# Patient Record
Sex: Female | Born: 1980 | Race: Black or African American | Hispanic: No | Marital: Single | Smoking: Never smoker
Health system: Southern US, Community
[De-identification: ages and names within clinical notes are randomized; demographics above are authoritative.]

## PROBLEM LIST (undated history)

## (undated) DIAGNOSIS — J45909 Unspecified asthma, uncomplicated: Secondary | ICD-10-CM

---

## 2015-11-09 ENCOUNTER — Encounter: Payer: Self-pay | Admitting: *Deleted

## 2015-11-09 ENCOUNTER — Emergency Department: Payer: Self-pay

## 2015-11-09 ENCOUNTER — Emergency Department
Admission: EM | Admit: 2015-11-09 | Discharge: 2015-11-09 | Disposition: A | Discharge status: Home / Self Care | Payer: Self-pay | Attending: Emergency Medicine | Admitting: Emergency Medicine

## 2015-11-09 DIAGNOSIS — R1011 Right upper quadrant pain: Secondary | ICD-10-CM | POA: Insufficient documentation

## 2015-11-09 DIAGNOSIS — Z91040 Latex allergy status: Secondary | ICD-10-CM | POA: Insufficient documentation

## 2015-11-09 DIAGNOSIS — R42 Dizziness and giddiness: Secondary | ICD-10-CM | POA: Insufficient documentation

## 2015-11-09 DIAGNOSIS — R112 Nausea with vomiting, unspecified: Secondary | ICD-10-CM | POA: Insufficient documentation

## 2015-11-09 DIAGNOSIS — M545 Low back pain: Secondary | ICD-10-CM | POA: Insufficient documentation

## 2015-11-09 DIAGNOSIS — F419 Anxiety disorder, unspecified: Secondary | ICD-10-CM | POA: Insufficient documentation

## 2015-11-09 DIAGNOSIS — R1033 Periumbilical pain: Secondary | ICD-10-CM | POA: Insufficient documentation

## 2015-11-09 DIAGNOSIS — R000 Tachycardia, unspecified: Secondary | ICD-10-CM | POA: Insufficient documentation

## 2015-11-09 DIAGNOSIS — Z3202 Encounter for pregnancy test, result negative: Secondary | ICD-10-CM | POA: Insufficient documentation

## 2015-11-09 DIAGNOSIS — R109 Unspecified abdominal pain: Secondary | ICD-10-CM

## 2015-11-09 HISTORY — DX: Unspecified asthma, uncomplicated: J45.909

## 2015-11-09 LAB — URINALYSIS COMPLETE WITH MICROSCOPIC (ARMC ONLY)
BILIRUBIN URINE: NEGATIVE
Glucose, UA: NEGATIVE mg/dL
KETONES UR: NEGATIVE mg/dL
NITRITE: NEGATIVE
PH: 5 (ref 5.0–8.0)
Protein, ur: 100 mg/dL — AB
Specific Gravity, Urine: 1.027 (ref 1.005–1.030)

## 2015-11-09 LAB — COMPREHENSIVE METABOLIC PANEL
ALBUMIN: 4.2 g/dL (ref 3.5–5.0)
ALT: 17 U/L (ref 14–54)
ANION GAP: 8 (ref 5–15)
AST: 21 U/L (ref 15–41)
Alkaline Phosphatase: 32 U/L — ABNORMAL LOW (ref 38–126)
BILIRUBIN TOTAL: 0.7 mg/dL (ref 0.3–1.2)
BUN: 18 mg/dL (ref 6–20)
CO2: 24 mmol/L (ref 22–32)
Calcium: 9 mg/dL (ref 8.9–10.3)
Chloride: 105 mmol/L (ref 101–111)
Creatinine, Ser: 0.85 mg/dL (ref 0.44–1.00)
GFR calc non Af Amer: >60 mL/min (ref >60)
GLUCOSE: 107 mg/dL — AB (ref 65–99)
POTASSIUM: 3.4 mmol/L — AB (ref 3.5–5.1)
Sodium: 137 mmol/L (ref 135–145)
TOTAL PROTEIN: 8.6 g/dL — AB (ref 6.5–8.1)

## 2015-11-09 LAB — CBC
HEMATOCRIT: 36.2 % (ref 35.0–47.0)
HEMOGLOBIN: 11.9 g/dL — AB (ref 12.0–16.0)
MCH: 26.8 pg (ref 26.0–34.0)
MCHC: 32.8 g/dL (ref 32.0–36.0)
MCV: 81.9 fL (ref 80.0–100.0)
Platelets: 370 10*3/uL (ref 150–440)
RBC: 4.42 MIL/uL (ref 3.80–5.20)
RDW: 16.7 % — ABNORMAL HIGH (ref 11.5–14.5)
WBC: 5.5 10*3/uL (ref 3.6–11.0)

## 2015-11-09 LAB — PREGNANCY, URINE: Preg Test, Ur: NEGATIVE

## 2015-11-09 LAB — LIPASE, BLOOD: Lipase: 27 U/L (ref 11–51)

## 2015-11-09 LAB — POCT PREGNANCY, URINE: Preg Test, Ur: NEGATIVE

## 2015-11-09 MED ORDER — ONDANSETRON HCL 4 MG/2ML IJ SOLN
4.0000 mg | Freq: Once | INTRAMUSCULAR | Status: AC
  Administered 2015-11-09: 4 mg via INTRAVENOUS
  Filled 2015-11-09: qty 2

## 2015-11-09 MED ORDER — MORPHINE SULFATE (PF) 4 MG/ML IV SOLN
4.0000 mg | Freq: Once | INTRAVENOUS | Status: AC
  Administered 2015-11-09: 4 mg via INTRAVENOUS
  Filled 2015-11-09: qty 1

## 2015-11-09 MED ORDER — SODIUM CHLORIDE 0.9 % IV SOLN
1000.0000 mL | Freq: Once | INTRAVENOUS | Status: AC
Start: 2015-11-09 — End: 2015-11-09
  Administered 2015-11-09: 1000 mL via INTRAVENOUS

## 2015-11-09 MED ORDER — IOHEXOL 300 MG/ML  SOLN
125.0000 mL | Freq: Once | INTRAMUSCULAR | Status: AC | PRN
  Administered 2015-11-09: 100 mL via INTRAVENOUS

## 2015-11-09 MED ORDER — TRAMADOL HCL 50 MG PO TABS: 50.0000 mg | ORAL_TABLET | Freq: Four times a day (QID) | ORAL | Status: AC | PRN

## 2015-11-09 MED ORDER — IOHEXOL 240 MG/ML SOLN: 25.0000 mL | Freq: Once | INTRAMUSCULAR | Status: DC | PRN

## 2015-11-09 NOTE — Discharge Instructions (Signed)
°  There is a 5.7 cm cystic structure within the right retroperitoneum which is nonspecific however may represent a benign lymphangioma. Recommend follow-up CT in 6 months to assess for stability.  Indeterminate cystic lesion within the inferior aspect of the right breast. Recommend correlation with dedicated mammography/ultrasound.     Abdominal Pain, Adult Many things can cause abdominal pain. Usually, abdominal pain is not caused by a disease and will improve without treatment. It can often be observed and treated at home. Your health care provider will do a physical exam and possibly order blood tests and X-rays to help determine the seriousness of your pain. However, in many cases, more time must pass before a clear cause of the pain can be found. Before that point, your health care provider may not know if you need more testing or further treatment. HOME CARE INSTRUCTIONS Monitor your abdominal pain for any changes. The following actions may help to alleviate any discomfort you are experiencing:  Only take over-the-counter or prescription medicines as directed by your health care provider.  Do not take laxatives unless directed to do so by your health care provider.  Try a clear liquid diet (broth, tea, or water) as directed by your health care provider. Slowly move to a bland diet as tolerated. SEEK MEDICAL CARE IF:  You have unexplained abdominal pain.  You have abdominal pain associated with nausea or diarrhea.  You have pain when you urinate or have a bowel movement.  You experience abdominal pain that wakes you in the night.  You have abdominal pain that is worsened or improved by eating food.  You have abdominal pain that is worsened with eating fatty foods.  You have a fever. SEEK IMMEDIATE MEDICAL CARE IF:  Your pain does not go away within 2 hours.  You keep throwing up (vomiting).  Your pain is felt only in portions of the abdomen, such as the right side or the  left lower portion of the abdomen.  You pass bloody or black tarry stools. MAKE SURE YOU:  Understand these instructions.  Will watch your condition.  Will get help right away if you are not doing well or get worse.   This information is not intended to replace advice given to you by your health care provider. Make sure you discuss any questions you have with your health care provider.   Document Released: 07/08/2005 Document Revised: 06/19/2015 Document Reviewed: 06/07/2013 Elsevier Interactive Patient Education [employer]

## 2015-11-09 NOTE — ED Notes (Signed)
Pt. Going home with friend, pt. Spending night in [place] with father.

## 2015-11-09 NOTE — ED Notes (Addendum)
Pt states she at work today and felt left sided abd pain radiating to her back, starts at her bellybutton, state she then began to vomit and have dizziness, states the nurse gave her a shot of phenergan, arrives alert and oriented and in no acute distress

## 2015-11-09 NOTE — ED Notes (Signed)
Report given to Matt, RN

## 2015-11-09 NOTE — ED Notes (Signed)
Patient transported to CT 

## 2015-11-09 NOTE — ED Provider Notes (Signed)
[facility] Emergency Department Provider Note  ____________________________________________    I have reviewed the triage vital signs and the nursing notes.   HISTORY  Chief Complaint Abdominal Pain and Emesis    HPI Brittany Cruz is a 35 y.o. female who presents with a proximally 4 days of central moderate abdominal pain which has been constant. She has been taking Motrin without relief. Today she became nauseated and vomited and felt dizzy and was told by her company to come to the emergency department. She denies fevers chills. No recent travel. No sick contacts. She feels her back pain is something different rather than related to her abdominal pain. She denies dysuria. She has no history of abdominal surgery. She has not been eating much because of the pain     Past Medical History  Diagnosis Date  . Asthma     There are no active problems to display for this patient.   History reviewed. No pertinent past surgical history.  No current outpatient prescriptions on file.  Allergies Latex; Shellfish allergy; and Tomato  History reviewed. No pertinent family history.  Social History Social History  Substance Use Topics  . Smoking status: Never Smoker   . Smokeless tobacco: None  . Alcohol Use: None    Review of Systems  Constitutional: Negative for fever. Eyes: Negative for visual changes. ENT: Negative for sore throat Cardiovascular: Negative for chest pain. Respiratory: Negative for shortness of breath. Gastrointestinal: As above Genitourinary: Negative for dysuria. Musculoskeletal: Low back pain Skin: Negative for rash. Neurological: Negative for headaches or focal weakness Psychiatric: Positive anxiety    ____________________________________________   PHYSICAL EXAM:  VITAL SIGNS: ED Triage Vitals  Enc Vitals Group     BP 11/09/15 1656 140/82 mmHg     Pulse Rate 11/09/15 1656 120     Resp 11/09/15 1656 18     Temp  11/09/15 1656 98.7 F (37.1 C)     Temp Source 11/09/15 1656 Oral     SpO2 11/09/15 1656 100 %     Weight 11/09/15 1656 250 lb (113.399 kg)     Height 11/09/15 1656  (1.575 m)     Head Cir --      Peak Flow --      Pain Score 11/09/15 1657 10     Pain Loc --      Pain Edu? --      Excl. in GC? --      Constitutional: Alert and oriented. Well appearing and in no distress. Eyes: Conjunctivae are normal.  ENT   Head: Normocephalic and atraumatic.   Mouth/Throat: Mucous membranes are moist. Cardiovascular: Tachycardia ,regular rhythm. Normal and symmetric distal pulses are present in all extremities. No murmurs, rubs, or gallops. Respiratory: Normal respiratory effort without tachypnea nor retractions.  Gastrointestinal: Patient is most tender to palpation in the right upper quadrant and around the umbilicus. No distention. There is no CVA tenderness. Genitourinary: deferred Musculoskeletal: Nontender with normal range of motion in all extremities. No lower extremity tenderness nor edema. Neurologic:  Normal speech and language. No gross focal neurologic deficits are appreciated. Skin:  Skin is warm, dry and intact. No rash noted. Psychiatric: Mood and affect are normal. Patient exhibits appropriate insight and judgment.  ____________________________________________    LABS (pertinent positives/negatives)  Labs Reviewed  COMPREHENSIVE METABOLIC PANEL - Abnormal; Notable for the following:    Potassium 3.4 (*)    Glucose, Bld 107 (*)    Total Protein 8.6 (*)  Alkaline Phosphatase 32 (*)    All other components within normal limits  CBC - Abnormal; Notable for the following:    Hemoglobin 11.9 (*)    RDW 16.7 (*)    All other components within normal limits  URINALYSIS COMPLETEWITH MICROSCOPIC ([facility] ONLY) - Abnormal; Notable for the following:    Color, Urine YELLOW (*)    APPearance CLOUDY (*)    Hgb urine dipstick 1+ (*)    Protein, ur 100 (*)     Leukocytes, UA 1+ (*)    Bacteria, UA RARE (*)    Squamous Epithelial / LPF 6-30 (*)    All other components within normal limits  LIPASE, BLOOD  POCT PREGNANCY, URINE    ____________________________________________   EKG  ED ECG REPORT I, Jene Every, the attending physician, personally viewed and interpreted this ECG.   Date: 11/09/2015  EKG Time: 5:02 PM  Rate: 127  Rhythm: sinus tachycardia  Axis: Normal axis  Intervals:none  ST&T Change: Nonspecific   ____________________________________________    RADIOLOGY I have personally reviewed any xrays that were ordered on this patient: No acute abnormalities. Discussed incidental findings including right breast lesion which the patient about and retroperitoneal cyst which she will follow-up in 6 months with another CT scan  ____________________________________________   PROCEDURES  Procedure(s) performed: none  Critical Care performed: none  ____________________________________________   INITIAL IMPRESSION / ASSESSMENT AND PLAN / ED COURSE  Pertinent labs & imaging results that were available during my care of the patient were reviewed by me and considered in my medical decision making (see chart for details).  Patient presents with central abdominal pain of unclear etiology. Certainly she seems most tender around the gallbladder area which is  on the differential. She may also be having gastritis versus PUD versus pancreatitis. We will check labs, give fluids, morphine, Zofran and reevaluate  Ultrasound does show cholelithiasis but no cholecystitis  CT scan is unremarkable, incidental findings discussed with patient.  I will discharge the patient with outpatient follow-up. Return precautions discussed.  ____________________________________________   FINAL CLINICAL IMPRESSION(S) / ED DIAGNOSES  Final diagnoses:  Abdominal pain     Jene Every, MD 11/09/15 2033

## 2017-08-25 IMAGING — CT CT ABD-PELV W/ CM
1 of 2 series · 15 of 32 positions shown, 19 images · IV contrast (omnipaque)
Comparison: Ultrasound abdomen 11/01/2015

CLINICAL DATA: Patient with left lower quadrant pain for 6 days.
Nausea and vomiting. History of hernia repair.

EXAM:
CT ABDOMEN AND PELVIS WITH CONTRAST
TECHNIQUE: Multidetector CT imaging of the abdomen and pelvis was performed
using the standard protocol following bolus administration of
intravenous contrast.
CONTRAST:  125mL OMNIPAQUE IOHEXOL 300 MG/ML  SOLN

[Series 2: routine abd pel with · axial · 0.83mm/px · z∈[-362,+18]mm · 15 of 84 slices shown, 19 images]
[im 4/84  soft-tissue]
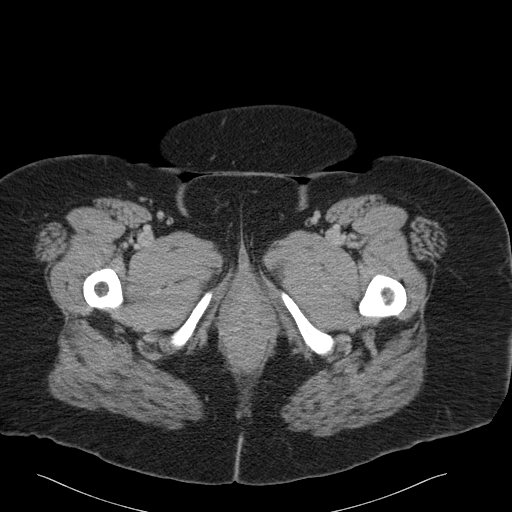
[im 4/84  bone]
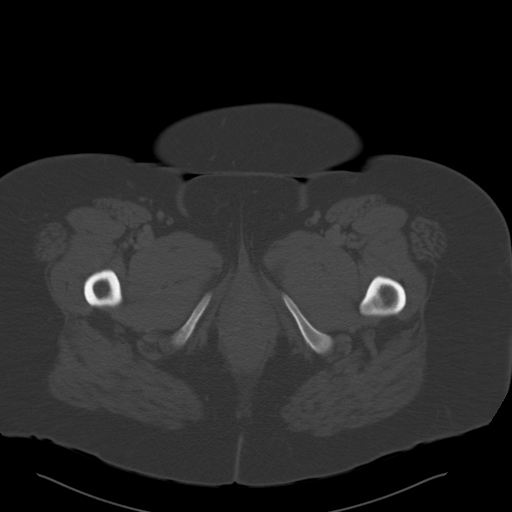
[im 11/84  soft-tissue]
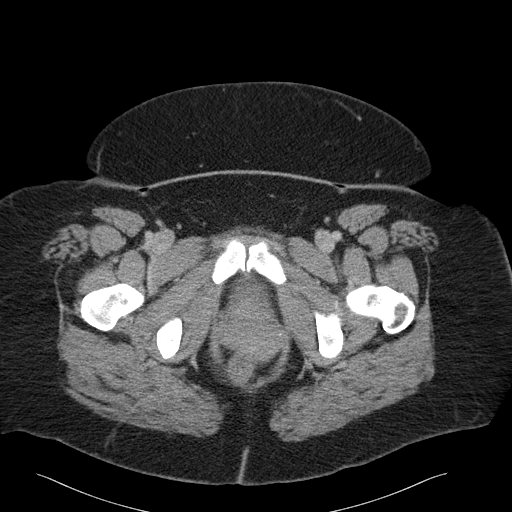
[im 19/84  soft-tissue]
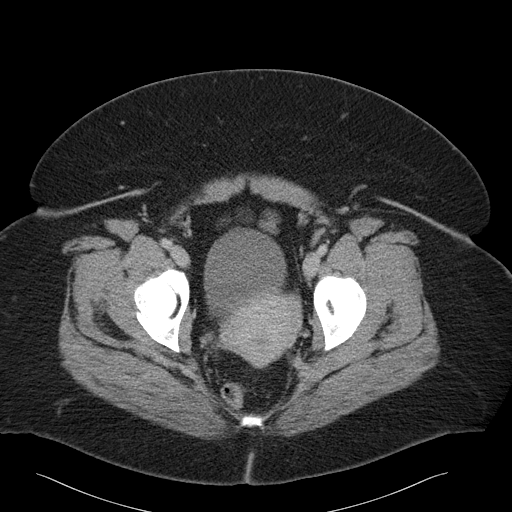
[im 22/84  soft-tissue]
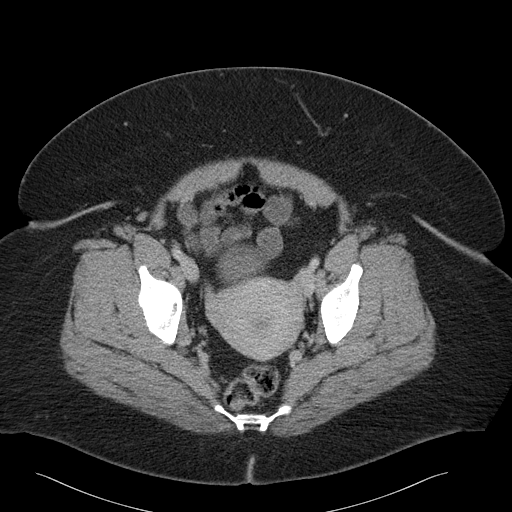
[im 29/84  soft-tissue]
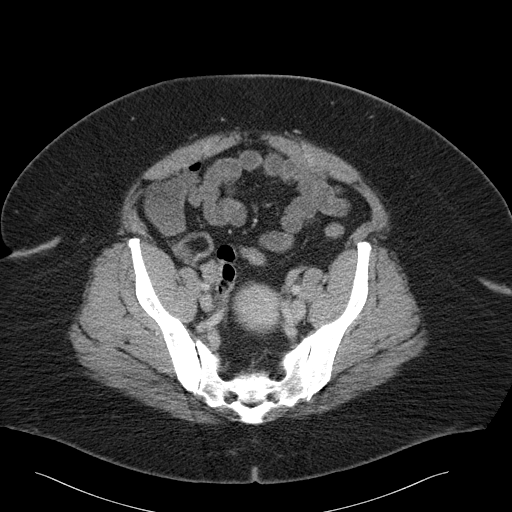
[im 37/84  soft-tissue]
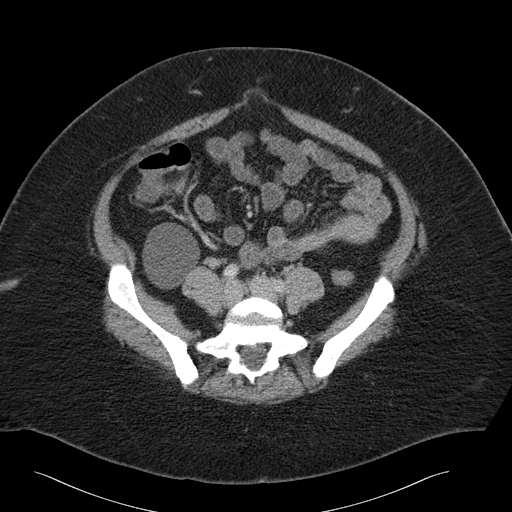
[im 44/84  soft-tissue]
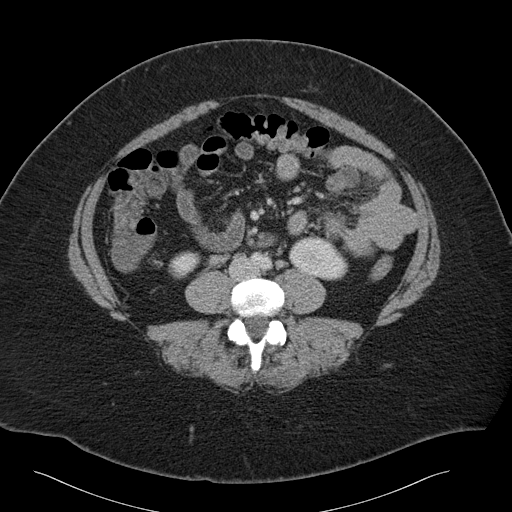
[im 47/84  soft-tissue]
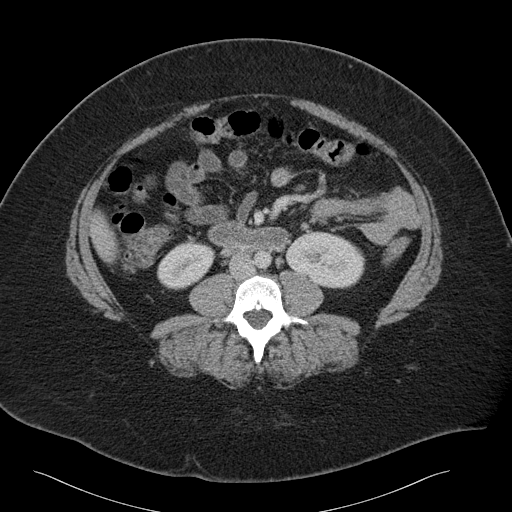
[im 55/84  soft-tissue]
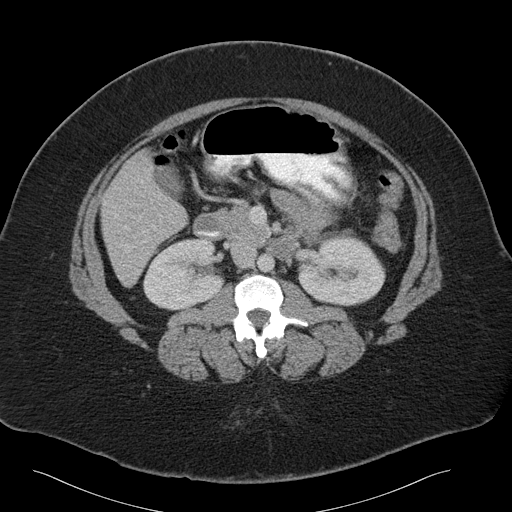
[im 55/84  bone]
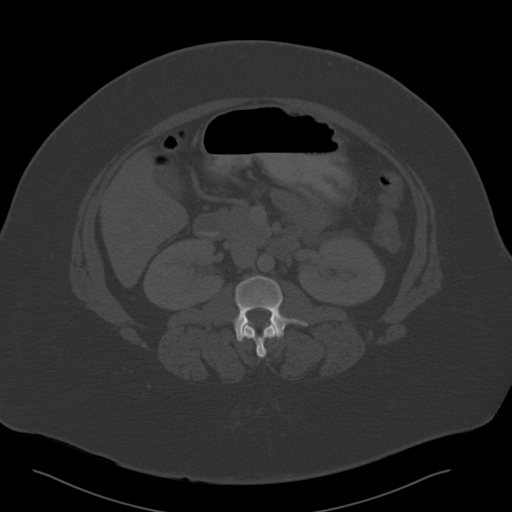
[im 62/84  soft-tissue]
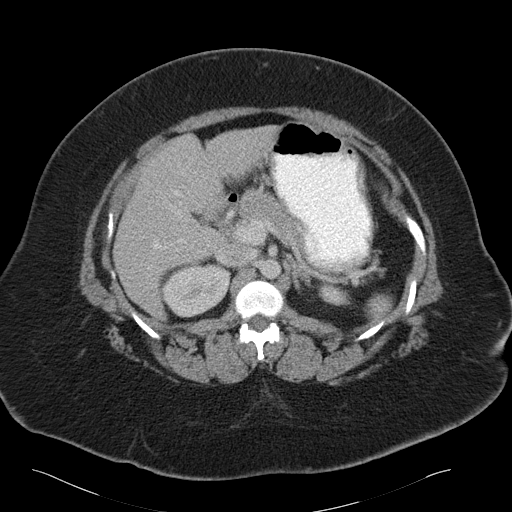
[im 65/84  soft-tissue]
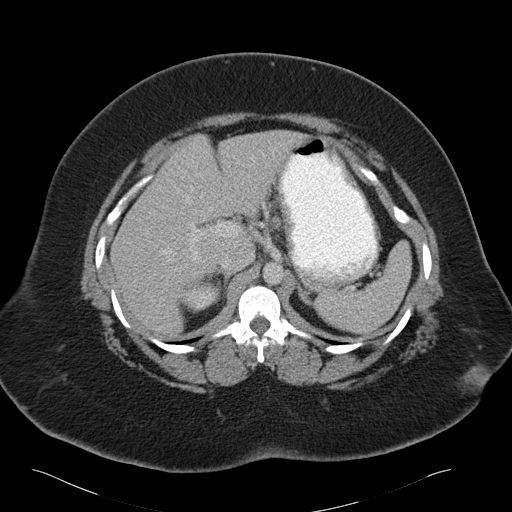
[im 69/84  lung]
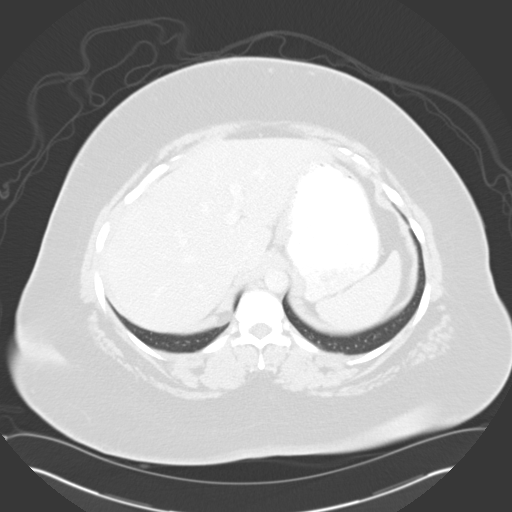
[im 73/84  soft-tissue]
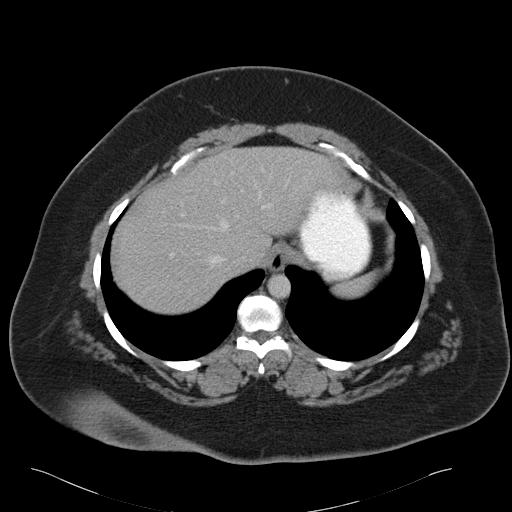
[im 73/84  lung]
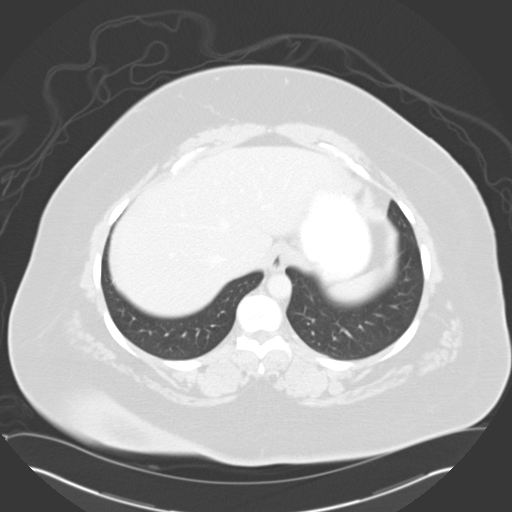
[im 76/84  lung]
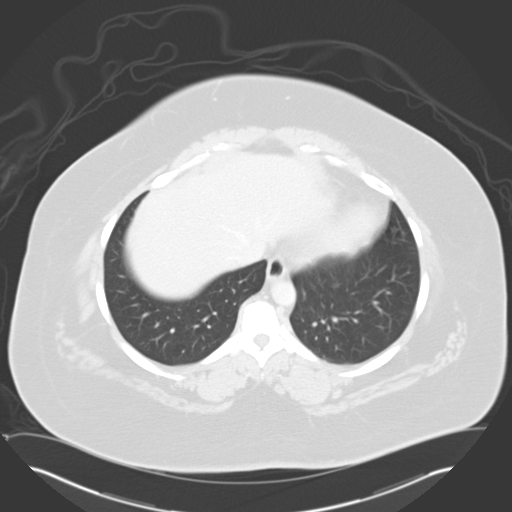
[im 80/84  soft-tissue]
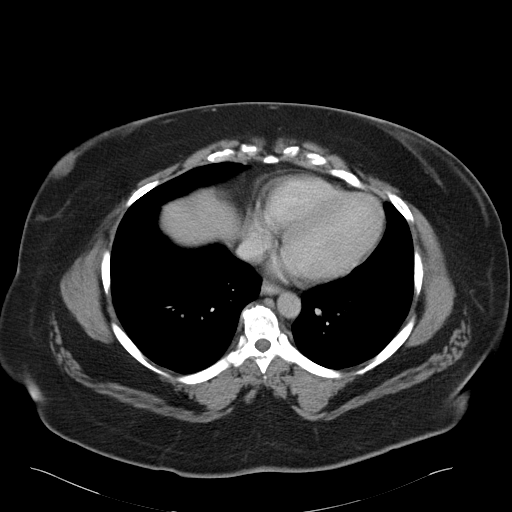
[im 80/84  lung]
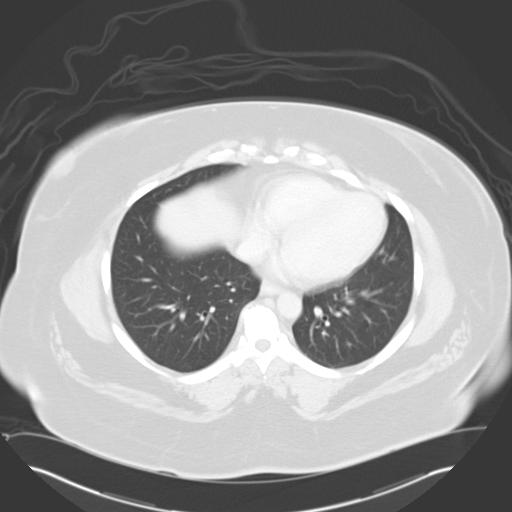

[15 of 32 positions shown; findings below may reference images not displayed]

FINDINGS: Lower chest: There is a 3.4 cm low-attenuation circumscribed mass
within the inferior aspect of the right breast (image 3; series 2).
Normal heart size. Lung bases are clear. No pleural effusion.

Hepatobiliary: The liver is normal in size and contour. No focal
hepatic lesion identified. Cholelithiasis. No gallbladder wall
thickening. No intrahepatic or extrahepatic biliary ductal
dilatation.

Pancreas: Unremarkable

Spleen: Unremarkable

Adrenals/Urinary Tract: Normal adrenal glands. Kidneys enhance
symmetrically with contrast. No hydronephrosis. The urinary bladder
is unremarkable.

Stomach/Bowel: No abnormal bowel wall thickening or evidence for
bowel obstruction. No free fluid or free intraperitoneal air. The
appendix is normal. Normal morphology to the stomach.

Vascular/Lymphatic: Normal caliber abdominal aorta. No
retroperitoneal lymphadenopathy.

Other: There is a 5.7 x 4.9 cm cystic structure within the right
retroperitoneum (image 47; series 2).

Musculoskeletal: Lumbar spine degenerative changes. No aggressive or
acute appearing osseous lesions. Fat containing periumbilical
hernia.
IMPRESSION: No acute process within the abdomen or pelvis.

There is a 5.7 cm cystic structure within the right retroperitoneum
which is nonspecific however may represent a benign lymphangioma.
Recommend follow-up CT in 6 months to assess for stability.

Indeterminate cystic lesion within the inferior aspect of the right
breast. Recommend correlation with dedicated mammography/ultrasound.
